# Patient Record
Sex: Male | Born: 1945 | Race: White | Hispanic: No | Marital: Married | Smoking: Current some day smoker
Health system: Southern US, Community
[De-identification: ages and names within clinical notes are randomized; demographics above are authoritative.]

## PROBLEM LIST (undated history)

## (undated) DIAGNOSIS — I82409 Acute embolism and thrombosis of unspecified deep veins of unspecified lower extremity: Secondary | ICD-10-CM

## (undated) HISTORY — PX: OTHER SURGICAL HISTORY: SHX169

## (undated) HISTORY — DX: Acute embolism and thrombosis of unspecified deep veins of unspecified lower extremity: I82.409

---

## 2003-11-20 ENCOUNTER — Emergency Department (HOSPITAL_COMMUNITY): Admission: EM | Admit: 2003-11-20 | Discharge: 2003-11-20 | Payer: Self-pay | Admitting: Emergency Medicine

## 2003-11-24 ENCOUNTER — Observation Stay (HOSPITAL_COMMUNITY): Admission: EM | Admit: 2003-11-24 | Discharge: 2003-11-25 | Payer: Self-pay | Admitting: Emergency Medicine

## 2003-11-24 ENCOUNTER — Ambulatory Visit (HOSPITAL_COMMUNITY): Admission: RE | Admit: 2003-11-24 | Discharge: 2003-11-24 | Payer: Self-pay | Admitting: Specialist

## 2003-12-15 ENCOUNTER — Encounter: Admission: RE | Admit: 2003-12-15 | Discharge: 2003-12-15 | Payer: Self-pay | Admitting: Specialist

## 2004-06-05 ENCOUNTER — Ambulatory Visit (HOSPITAL_COMMUNITY): Admission: RE | Admit: 2004-06-05 | Discharge: 2004-06-05 | Payer: Self-pay | Admitting: Emergency Medicine

## 2012-05-28 DIAGNOSIS — S8010XA Contusion of unspecified lower leg, initial encounter: Secondary | ICD-10-CM | POA: Diagnosis not present

## 2012-05-28 DIAGNOSIS — IMO0002 Reserved for concepts with insufficient information to code with codable children: Secondary | ICD-10-CM | POA: Diagnosis not present

## 2012-06-04 DIAGNOSIS — IMO0002 Reserved for concepts with insufficient information to code with codable children: Secondary | ICD-10-CM | POA: Diagnosis not present

## 2012-06-04 DIAGNOSIS — S8010XA Contusion of unspecified lower leg, initial encounter: Secondary | ICD-10-CM | POA: Diagnosis not present

## 2012-10-14 ENCOUNTER — Ambulatory Visit: Payer: Medicare Other

## 2012-10-14 ENCOUNTER — Ambulatory Visit (INDEPENDENT_AMBULATORY_CARE_PROVIDER_SITE_OTHER): Payer: Medicare Other | Admitting: Family Medicine

## 2012-10-14 VITALS — BP 100/66 | HR 39 | Temp 97.7°F | Resp 18 | Ht 70.0 in | Wt 164.0 lb

## 2012-10-14 DIAGNOSIS — S59909A Unspecified injury of unspecified elbow, initial encounter: Secondary | ICD-10-CM

## 2012-10-14 DIAGNOSIS — S6991XA Unspecified injury of right wrist, hand and finger(s), initial encounter: Secondary | ICD-10-CM

## 2012-10-14 DIAGNOSIS — M25569 Pain in unspecified knee: Secondary | ICD-10-CM

## 2012-10-14 DIAGNOSIS — M25562 Pain in left knee: Secondary | ICD-10-CM

## 2012-10-14 DIAGNOSIS — L0390 Cellulitis, unspecified: Secondary | ICD-10-CM

## 2012-10-14 DIAGNOSIS — S6990XA Unspecified injury of unspecified wrist, hand and finger(s), initial encounter: Secondary | ICD-10-CM | POA: Diagnosis not present

## 2012-10-14 DIAGNOSIS — S0993XA Unspecified injury of face, initial encounter: Secondary | ICD-10-CM | POA: Diagnosis not present

## 2012-10-14 DIAGNOSIS — S199XXA Unspecified injury of neck, initial encounter: Secondary | ICD-10-CM

## 2012-10-14 MED ORDER — DOXYCYCLINE HYCLATE 100 MG PO TABS: 100.0000 mg | ORAL_TABLET | Freq: Two times a day (BID) | ORAL | Status: DC

## 2012-10-14 NOTE — Progress Notes (Signed)
[facility]:  Office Visit  Chief Complaint:  Chief Complaint  Patient presents with  . injury to face    running monday night and fell on face, now has rash on right side of face with some swelling     HPI: Philip Huber is a 67 y.o. male who complains of : 1. Monday night was running at [facility] and had lots of trees and roots on the ground, he runs 3 miles in 20 minutes, and was going fast, hit a root with one of his foot and hit his head on the ground and the back of his head hit more than the front of his head and he slid on his face when he fell. He had a little HA. He did not have any LOC, denies n/v/double vision/blurred vision. This was Monday around 7 pm. He presents in the office with facial swelling on the right side. He denies any pain/throbbing/aching. "feels weird because feels big". His face does itch. He has been running for 30 years.   2. 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 Now it does not even hurt.   3. Left knee pain x 2 weeks, posterior and medial aspect. He only has pain whne he does the moon walk. He states it is getting better.  He has had it for 3 weeks, using pillow underneath his   No prior injuries or surgeries  No osteoporosis or osteopenia No asa or other blood thinners  History reviewed. No pertinent past medical history. History reviewed. No pertinent past surgical history. History   Social History  . Marital Status: Married    Spouse Name: N/A    Number of Children: N/A  . Years of Education: N/A   Social History Main Topics  . Smoking status: Current Some Day Smoker  . Smokeless tobacco: None  . Alcohol Use: Yes  . Drug Use: No  . Sexually Active: None   Other Topics Concern  . None   Social History Narrative  . None   History reviewed. No  pertinent family history. Allergies  Allergen Reactions  . Levaquin (Levofloxacin In D5w)    Prior to Admission medications   Not on File     ROS: The patient denies fevers, chills, night sweats, unintentional weight loss, chest pain, palpitations, wheezing, dyspnea on exertion, nausea, vomiting, abdominal pain, dysuria, hematuria, melena, numbness, weakness, or tingling.   All other systems have been reviewed and were otherwise negative with the exception of those mentioned in the HPI and as above.    PHYSICAL EXAM: Filed Vitals:   10/14/12 1546  BP: 100/66  Pulse: 39  Temp: 97.7 F (36.5 C)  Resp: 18   Filed Vitals:   10/14/12 1546  Height: 5\' 10"  (1.778 m)  Weight: 164 lb (74.39 kg)   Body mass index is 23.53 kg/(m^2).  General: Alert, no acute distress HEENT:  Normocephalic, atraumatic, oropharynx patent. EOMI, PERRLA, fundoscopic exam normal.  Cardiovascular:  Regular rate and rhythm, no rubs murmurs or gallops.  No Carotid bruits, radial pulse intact. No pedal edema.  Respiratory: Clear to auscultation bilaterally.  No wheezes, rales, or rhonchi.  No cyanosis, no use of accessory musculature GI: No organomegaly, abdomen is soft and non-tender, positive bowel sounds.  No masses. Skin: +  warm erythematous area, scab with slight swelling and erythema around 1/4 of his mid face. No vesicles, no e/o shingles. It is slighlty red, beefy cellulitic like Neurologic: Facial musculature symmetric. Psychiatric: Patient is appropriate throughout our interaction. Lymphatic: No cervical lymphadenopathy Musculoskeletal: Gait intact. Knee- left posterior medial nontender, full ROM, 5/5 strength, he has tenderness when he moonwalks, tight hamstring. +/- jt line tenderness Wrist and hand-full ROM, 5/5 strength, sensation intact   LABS: No results found for this or any previous visit.   EKG/XRAY:   Primary read interpreted by Dr. Conley Rolls at [facility]. No obvious fx/dislocation Please  comment on right temporal area , mastoid region   ASSESSMENT/PLAN: Encounter Diagnoses  Name Primary?  . Facial injury, initial encounter Yes  . Wrist injury, right, initial encounter   . Hand injury, right, initial encounter   . Left knee pain    He has had HR 45 at [facility] in March Facial contusion with scab and cellulitic rash vs dermatitis s/p fall He has abrasion that may be the source of the cellulitis Ligamentous strain on knee Wrist pain is just spraina dn strain s/p fall Rx Doxycycline 100 mg BID x 10 days Official rad results pending Gross sideeffects, risk and benefits, and alternatives of medications d/w patient. Patient is aware that all medications have potential sideeffects and we are unable to predict every sideeffect or drug-drug interaction that may occur. F/u prn     Kwasi Joung PHUONG, DO 10/14/2012 5:55 PM

## 2014-10-20 IMAGING — CR DG FACIAL BONES COMPLETE 3+V
3 series · 3 of 3 positions shown · non-contrast
Comparison: None.

CLINICAL DATA: Fall with facial injuries.

FACIAL BONES COMPLETE 3+V

[pa [person_name]]
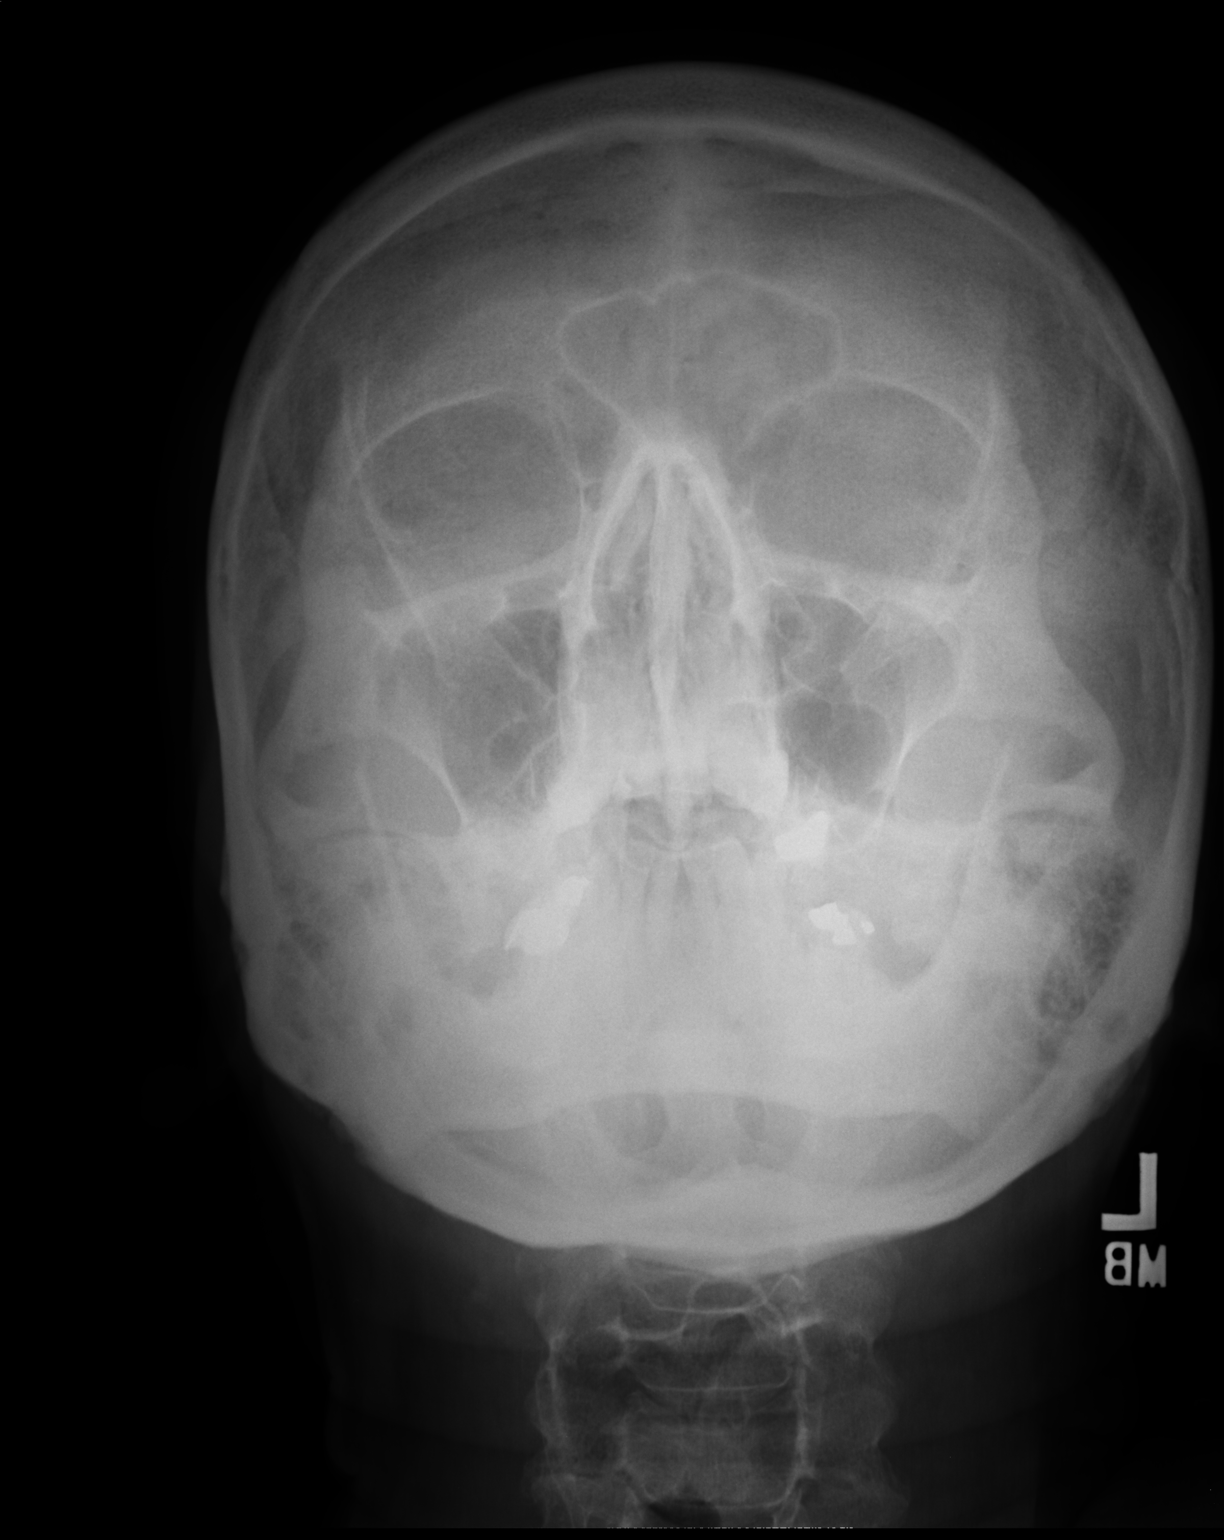

[waters]
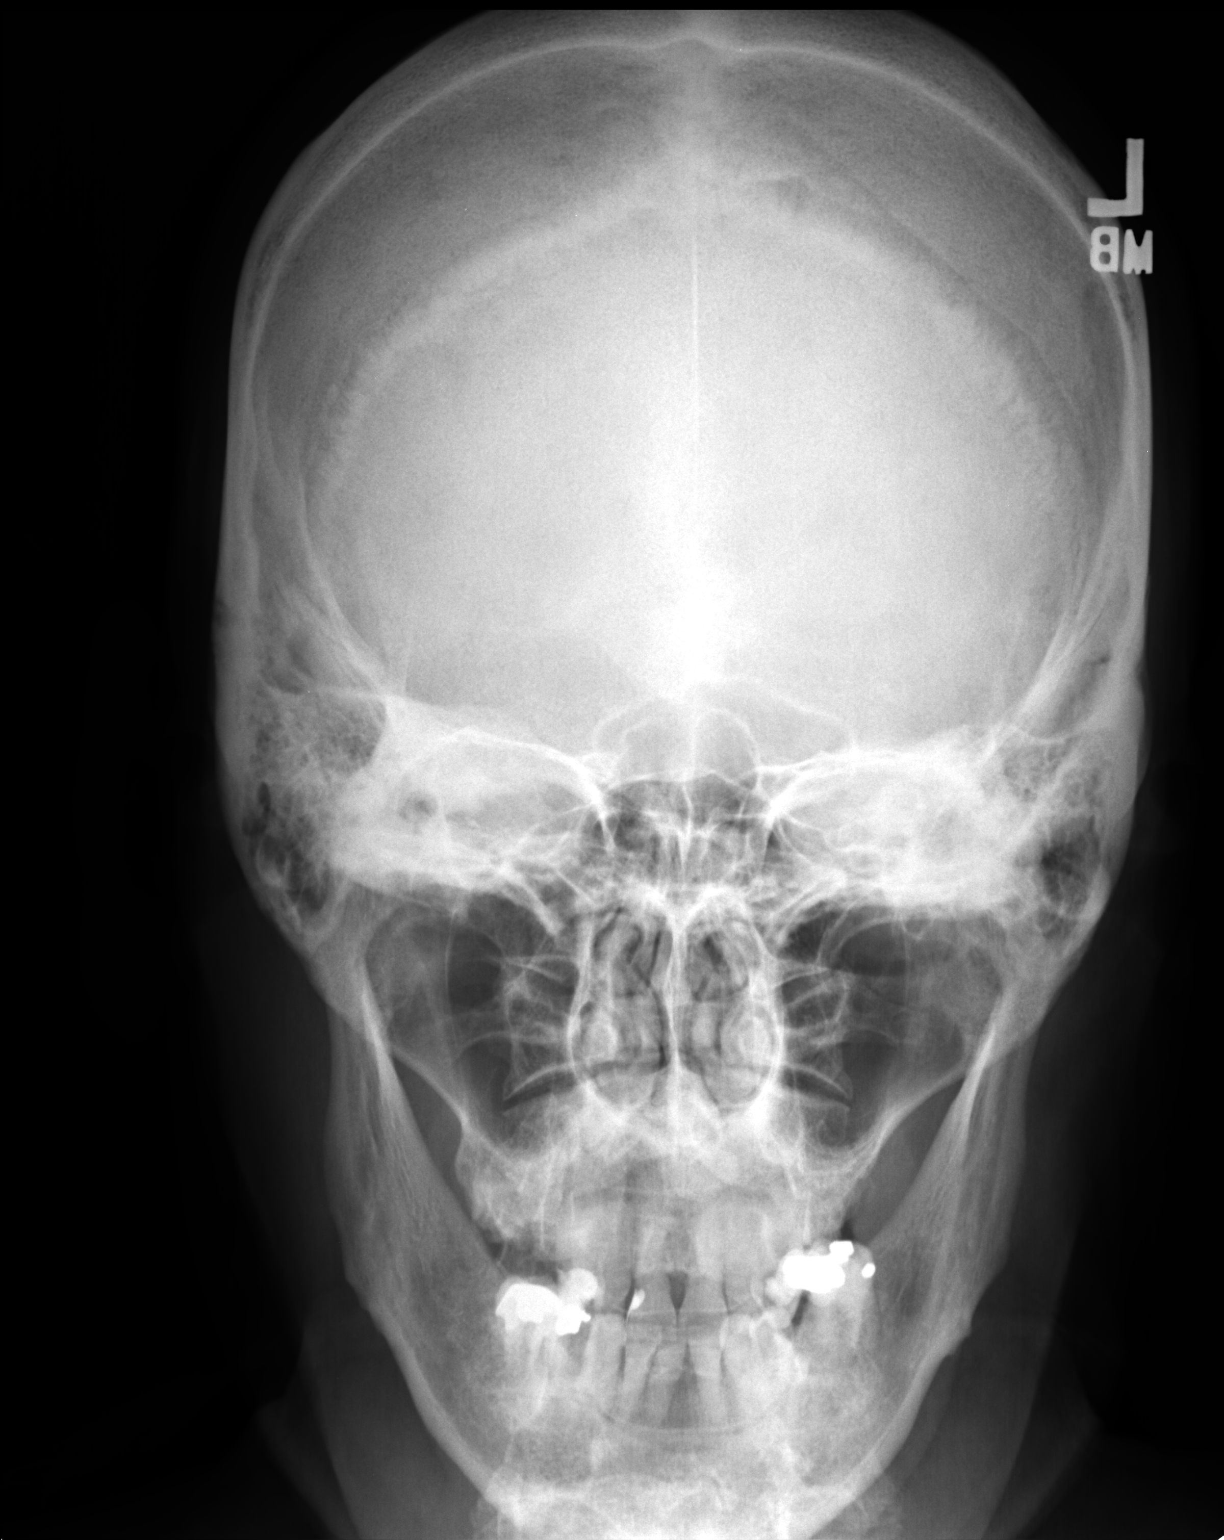

[lateral]
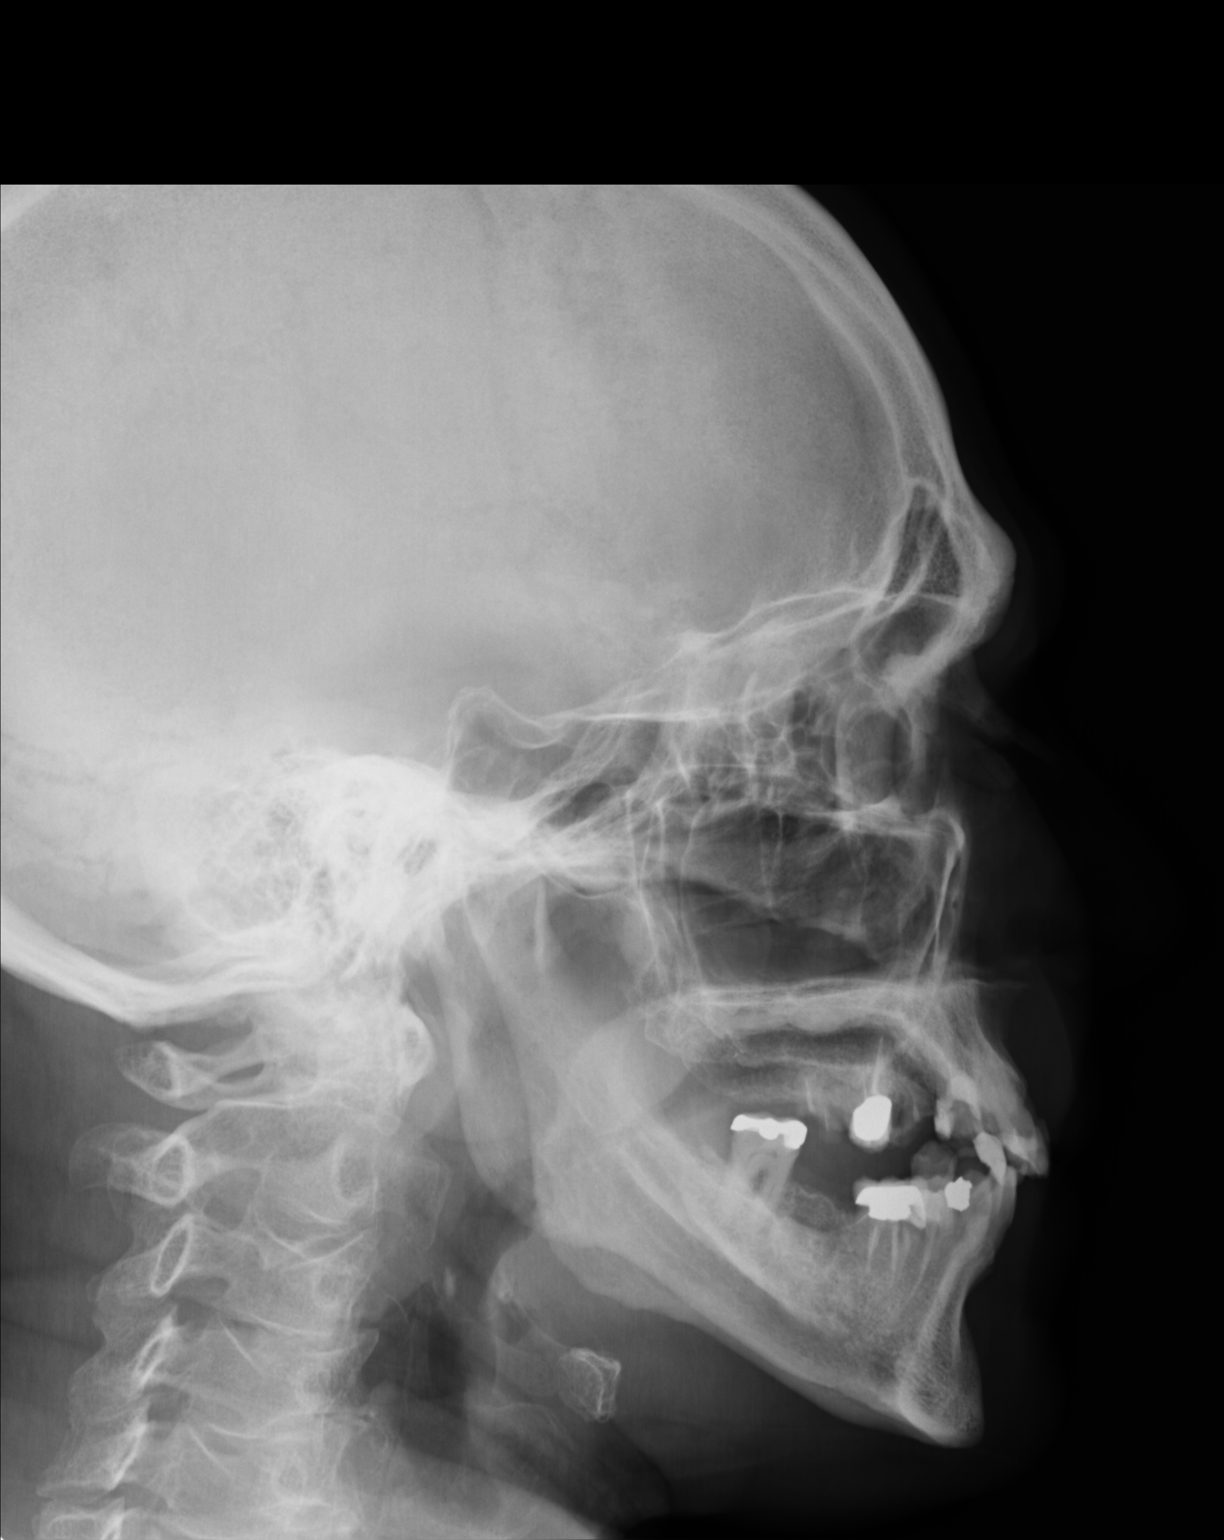

[3 of 3 positions shown; findings below may reference images not displayed]

FINDINGS: No acute fracture of the facial bones is identified.  The
visualized paranasal sinuses appear normally aerated.  The nasal
septum is in the midline.  No soft tissue foreign body is seen.
IMPRESSION: Normal facial bone radiographs.

Clinically significant discrepancy from primary report, if
provided: None

## 2015-01-19 DIAGNOSIS — L570 Actinic keratosis: Secondary | ICD-10-CM | POA: Diagnosis not present

## 2015-01-19 DIAGNOSIS — M25561 Pain in right knee: Secondary | ICD-10-CM | POA: Diagnosis not present

## 2015-01-30 DIAGNOSIS — M25561 Pain in right knee: Secondary | ICD-10-CM | POA: Diagnosis not present

## 2015-03-05 DIAGNOSIS — L821 Other seborrheic keratosis: Secondary | ICD-10-CM | POA: Diagnosis not present

## 2015-03-05 DIAGNOSIS — L570 Actinic keratosis: Secondary | ICD-10-CM | POA: Diagnosis not present

## 2015-03-05 DIAGNOSIS — D1801 Hemangioma of skin and subcutaneous tissue: Secondary | ICD-10-CM | POA: Diagnosis not present

## 2015-05-04 DIAGNOSIS — L570 Actinic keratosis: Secondary | ICD-10-CM | POA: Diagnosis not present

## 2015-05-22 DIAGNOSIS — Z23 Encounter for immunization: Secondary | ICD-10-CM | POA: Diagnosis not present

## 2015-05-22 DIAGNOSIS — Z0000 Encounter for general adult medical examination without abnormal findings: Secondary | ICD-10-CM | POA: Diagnosis not present

## 2015-05-22 DIAGNOSIS — Z7289 Other problems related to lifestyle: Secondary | ICD-10-CM | POA: Diagnosis not present

## 2015-05-22 DIAGNOSIS — Z86718 Personal history of other venous thrombosis and embolism: Secondary | ICD-10-CM | POA: Diagnosis not present

## 2015-05-22 DIAGNOSIS — I444 Left anterior fascicular block: Secondary | ICD-10-CM | POA: Diagnosis not present

## 2015-05-22 DIAGNOSIS — R001 Bradycardia, unspecified: Secondary | ICD-10-CM | POA: Diagnosis not present

## 2015-05-22 DIAGNOSIS — Z125 Encounter for screening for malignant neoplasm of prostate: Secondary | ICD-10-CM | POA: Diagnosis not present

## 2015-05-22 DIAGNOSIS — Z136 Encounter for screening for cardiovascular disorders: Secondary | ICD-10-CM | POA: Diagnosis not present

## 2015-05-30 DIAGNOSIS — R05 Cough: Secondary | ICD-10-CM | POA: Diagnosis not present

## 2015-05-30 DIAGNOSIS — J209 Acute bronchitis, unspecified: Secondary | ICD-10-CM | POA: Diagnosis not present

## 2015-05-31 ENCOUNTER — Telehealth: Payer: Self-pay | Admitting: Cardiology

## 2015-05-31 NOTE — Telephone Encounter (Signed)
Received records from [place] for appointment on 06/14/15 with Dr Percival Spanish.  Records given to [facility] (medical records) for Dr Hochrein's schedule on 06/14/15. lp

## 2015-06-11 NOTE — Progress Notes (Signed)
Cardiology Office Note   Date:  06/12/2015   ID:  Philip, Huber 11/19/1945, MRN [id number]  PCP:  No PCP Per Patient  Cardiologist:   Minus Breeding, MD   Chief Complaint  Patient presents with  . New Evaluation      History of Present Illness: Philip Huber is a 70 y.o. male who presents for Evaluation of an abnormal EKG. The patient has no past cardiac history. He has been an avid exerciser all of his life. He's played hockey. He runs routinely. He still runs 3 miles daily. With this level of activity he denies any cardiovascular symptoms. The patient denies any new symptoms such as chest discomfort, neck or arm discomfort. There has been no new shortness of breath, PND or orthopnea. There have been no reported palpitations, presyncope or syncope.  However, he has been noted to have a low heart rate. EKGs have demonstrated sinus rhythm in the 50s. 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 He also is noted to have poor anterior R wave progression and rare PACs.   Past Medical History  Diagnosis Date  . DVT (deep venous thrombosis) [facility])     Past Surgical History  Procedure Laterality Date  . None       No current outpatient prescriptions on file.   No current facility-administered medications for this visit.    Allergies:   Levaquin    Social History:  The patient  reports that he has been smoking.  He does not have any smokeless tobacco history on file. He reports that he drinks alcohol. He reports that he does not use illicit drugs.   Family History:  The patient's family history includes Stroke in his father and mother.    ROS:  Please see the history of present illness.   Otherwise, review of systems are positive for none.   All other systems are reviewed and negative.    PHYSICAL EXAM: VS:  BP 106/68 mmHg  Pulse 55  Ht 5\' 10"  (1.778 m)  Wt 162 lb (73.483 kg)  BMI 23.24 kg/m2 , BMI Body mass index is 23.24 kg/(m^2). GENERAL:  Well  appearing HEENT:  Pupils equal round and reactive, fundi not visualized, oral mucosa unremarkable NECK:  No jugular venous distention, waveform within normal limits, carotid upstroke brisk and symmetric, no bruits, no thyromegaly LYMPHATICS:  No cervical, inguinal adenopathy LUNGS:  Clear to auscultation bilaterally BACK:  No CVA tenderness CHEST:  Unremarkable HEART:  PMI not displaced or sustained,S1 and S2 within normal limits, no S3, no S4, no clicks, no rubs, no murmurs ABD:  Flat, positive bowel sounds normal in frequency in pitch, no bruits, no rebound, no guarding, no midline pulsatile mass, no hepatomegaly, no splenomegaly EXT:  2 plus pulses throughout, no edema, no cyanosis no clubbing SKIN:  Positive facial rashe no nodules NEURO:  Cranial nerves II through XII grossly intact, motor grossly intact throughout PSYCH:  Cognitively intact, oriented to person place and time    EKG:  EKG is ordered today. The ekg ordered today demonstrates sinus rhythm, rate 55, left axis deviation, left anterior fascicular block, poor anterior R wave progression, premature atrial contraction.   Recent Labs: No results found for requested labs within last 365 days.    Lipid Panel No results found for: CHOL, TRIG, HDL, CHOLHDL, VLDL, LDLCALC, LDLDIRECT    Wt Readings from Last 3 Encounters:  06/12/15 162 lb (73.483 kg)  10/14/12 164 lb (74.39 kg)  Other studies Reviewed: Additional studies/ records that were reviewed today include: Office records and labs. Review of the above records demonstrates:  Please see elsewhere in the note.     ASSESSMENT AND PLAN:  ABNORMAL EKG:  The patient does have poor anterior R wave progression. I suspect clockwise rotation of his heart in an athlete with probably big lungs. However, screening with stress testing is reasonable. I will bring the patient back for a POET (Plain Old Exercise Test). This will allow me to screen for obstructive coronary  disease, risk stratify and very importantly provide a prescription for exercise.  LAFB:  This is noted along with bradycardia. However, he has no symptoms. Bradycardia is probably related to heightened vagal tone related to his level of fitness. No further evaluation is warranted.   Current medicines are reviewed at length with the patient today.  The patient does not have concerns regarding medicines.  The following changes have been made:  no change  Labs/ tests ordered today include:   Orders Placed This Encounter  Procedures  . Exercise Tolerance Test  . EKG 12-Lead     Disposition:   FU with me as needed.      Signed, Minus Breeding, MD  06/12/2015 1:10 PM    [facility]

## 2015-06-12 ENCOUNTER — Encounter: Payer: Self-pay | Admitting: Cardiology

## 2015-06-12 ENCOUNTER — Ambulatory Visit (INDEPENDENT_AMBULATORY_CARE_PROVIDER_SITE_OTHER): Payer: Medicare Other | Admitting: Cardiology

## 2015-06-12 VITALS — BP 106/68 | HR 55 | Ht 70.0 in | Wt 162.0 lb

## 2015-06-12 DIAGNOSIS — R9431 Abnormal electrocardiogram [ECG] [EKG]: Secondary | ICD-10-CM

## 2015-06-12 NOTE — Patient Instructions (Signed)
Your physician recommends that you schedule a follow-up appointment in: As Needed  Your physician has requested that you have an exercise tolerance test. For further information please visit [email or web address]. Please also follow instruction sheet, as given.    

## 2015-06-14 ENCOUNTER — Ambulatory Visit: Payer: Self-pay | Admitting: Cardiology

## 2015-06-22 ENCOUNTER — Telehealth (HOSPITAL_COMMUNITY): Payer: Self-pay

## 2015-06-22 DIAGNOSIS — L570 Actinic keratosis: Secondary | ICD-10-CM | POA: Diagnosis not present

## 2015-06-22 NOTE — Telephone Encounter (Signed)
Encounter complete. 

## 2015-06-27 ENCOUNTER — Ambulatory Visit (HOSPITAL_COMMUNITY)
Admission: RE | Admit: 2015-06-27 | Discharge: 2015-06-27 | Disposition: A | Discharge status: Home / Self Care | Payer: Medicare Other | Source: Ambulatory Visit | Attending: Cardiology | Admitting: Cardiology

## 2015-06-27 DIAGNOSIS — R9439 Abnormal result of other cardiovascular function study: Secondary | ICD-10-CM | POA: Diagnosis not present

## 2015-06-27 DIAGNOSIS — R9431 Abnormal electrocardiogram [ECG] [EKG]: Secondary | ICD-10-CM | POA: Insufficient documentation

## 2015-06-28 LAB — EXERCISE TOLERANCE TEST
CHL CUP MPHR: 150 {beats}/min
CSEPEW: 15.4 METS
CSEPHR: 94 %
CSEPPHR: 141 {beats}/min
Exercise duration (min): 13 min
Exercise duration (sec): 4 s
RPE: 17
Rest HR: 40 {beats}/min

## 2016-01-10 DIAGNOSIS — D1801 Hemangioma of skin and subcutaneous tissue: Secondary | ICD-10-CM | POA: Diagnosis not present

## 2016-01-10 DIAGNOSIS — L821 Other seborrheic keratosis: Secondary | ICD-10-CM | POA: Diagnosis not present

## 2016-01-10 DIAGNOSIS — L814 Other melanin hyperpigmentation: Secondary | ICD-10-CM | POA: Diagnosis not present

## 2016-01-10 DIAGNOSIS — D225 Melanocytic nevi of trunk: Secondary | ICD-10-CM | POA: Diagnosis not present

## 2020-04-30 DIAGNOSIS — H52223 Regular astigmatism, bilateral: Secondary | ICD-10-CM | POA: Diagnosis not present

## 2020-04-30 DIAGNOSIS — H524 Presbyopia: Secondary | ICD-10-CM | POA: Diagnosis not present

## 2020-04-30 DIAGNOSIS — H5203 Hypermetropia, bilateral: Secondary | ICD-10-CM | POA: Diagnosis not present

## 2020-04-30 DIAGNOSIS — H25043 Posterior subcapsular polar age-related cataract, bilateral: Secondary | ICD-10-CM | POA: Diagnosis not present

## 2020-09-21 DIAGNOSIS — L309 Dermatitis, unspecified: Secondary | ICD-10-CM | POA: Diagnosis not present

## 2020-09-21 DIAGNOSIS — R1011 Right upper quadrant pain: Secondary | ICD-10-CM | POA: Diagnosis not present

## 2020-09-24 ENCOUNTER — Other Ambulatory Visit: Payer: Self-pay | Admitting: Family Medicine

## 2020-09-24 DIAGNOSIS — R1011 Right upper quadrant pain: Secondary | ICD-10-CM

## 2020-10-03 ENCOUNTER — Ambulatory Visit
Admission: RE | Admit: 2020-10-03 | Discharge: 2020-10-03 | Disposition: A | Discharge status: Home / Self Care | Payer: Medicare Other | Source: Ambulatory Visit | Attending: Family Medicine | Admitting: Family Medicine

## 2020-10-03 DIAGNOSIS — R1011 Right upper quadrant pain: Secondary | ICD-10-CM | POA: Diagnosis not present

## 2020-10-18 DIAGNOSIS — Z0000 Encounter for general adult medical examination without abnormal findings: Secondary | ICD-10-CM | POA: Diagnosis not present

## 2020-10-23 DIAGNOSIS — Z1211 Encounter for screening for malignant neoplasm of colon: Secondary | ICD-10-CM | POA: Diagnosis not present

## 2022-01-22 DIAGNOSIS — Z79899 Other long term (current) drug therapy: Secondary | ICD-10-CM | POA: Diagnosis not present

## 2022-01-22 DIAGNOSIS — L309 Dermatitis, unspecified: Secondary | ICD-10-CM | POA: Diagnosis not present

## 2022-01-22 DIAGNOSIS — Z136 Encounter for screening for cardiovascular disorders: Secondary | ICD-10-CM | POA: Diagnosis not present

## 2022-01-22 DIAGNOSIS — Z0000 Encounter for general adult medical examination without abnormal findings: Secondary | ICD-10-CM | POA: Diagnosis not present

## 2022-01-22 DIAGNOSIS — F17200 Nicotine dependence, unspecified, uncomplicated: Secondary | ICD-10-CM | POA: Diagnosis not present

## 2022-03-14 DIAGNOSIS — U071 COVID-19: Secondary | ICD-10-CM | POA: Diagnosis not present

## 2022-03-14 DIAGNOSIS — R110 Nausea: Secondary | ICD-10-CM | POA: Diagnosis not present

## 2022-03-25 DIAGNOSIS — U071 COVID-19: Secondary | ICD-10-CM | POA: Diagnosis not present

## 2022-09-24 DIAGNOSIS — K4090 Unilateral inguinal hernia, without obstruction or gangrene, not specified as recurrent: Secondary | ICD-10-CM | POA: Diagnosis not present

## 2022-10-09 IMAGING — US US ABDOMEN LIMITED
1 series · 14 of 25 positions shown · non-contrast
Comparison: None.

CLINICAL DATA: Right upper quadrant abdominal pain

EXAM:
ULTRASOUND ABDOMEN LIMITED RIGHT UPPER QUADRANT

[Series 1: us abdomen limited · 0.14mm/px · 14 of 47 slices shown]
[im 1/47]
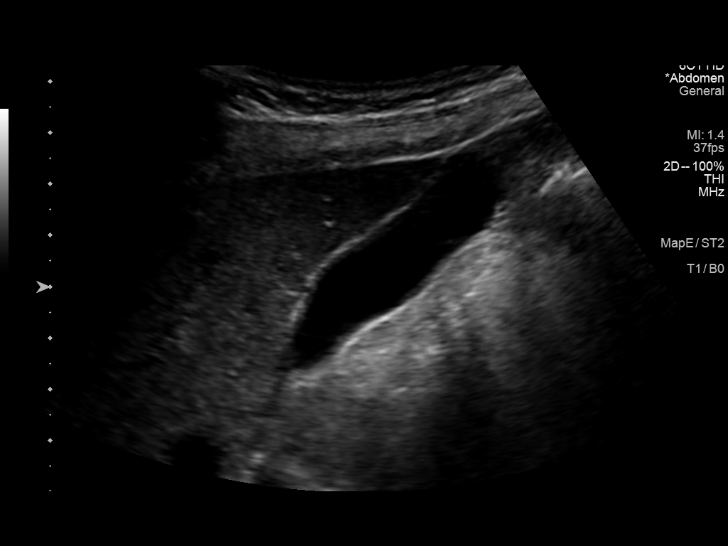
[im 4/47]
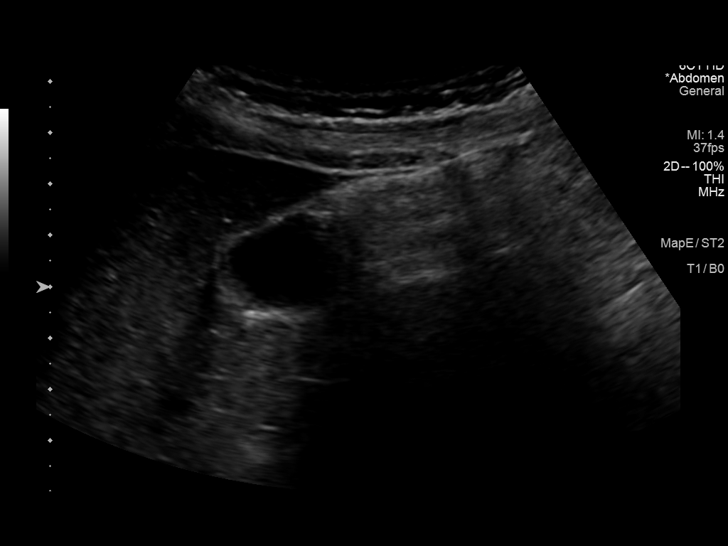
[im 8/47]
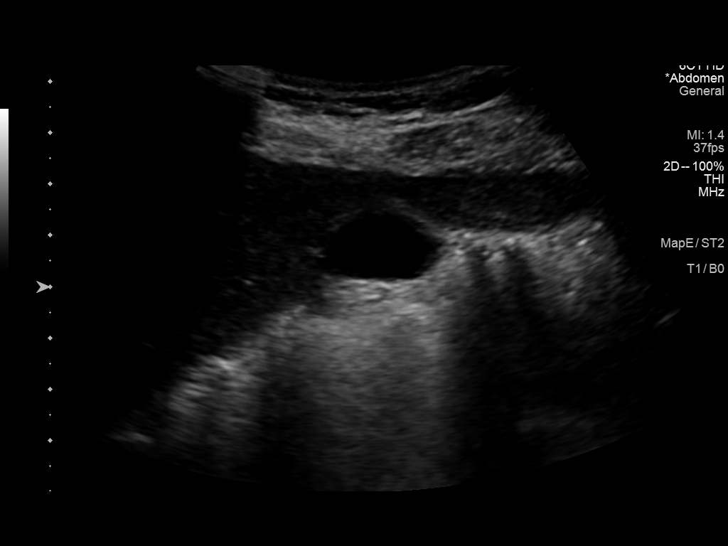
[im 12/47]
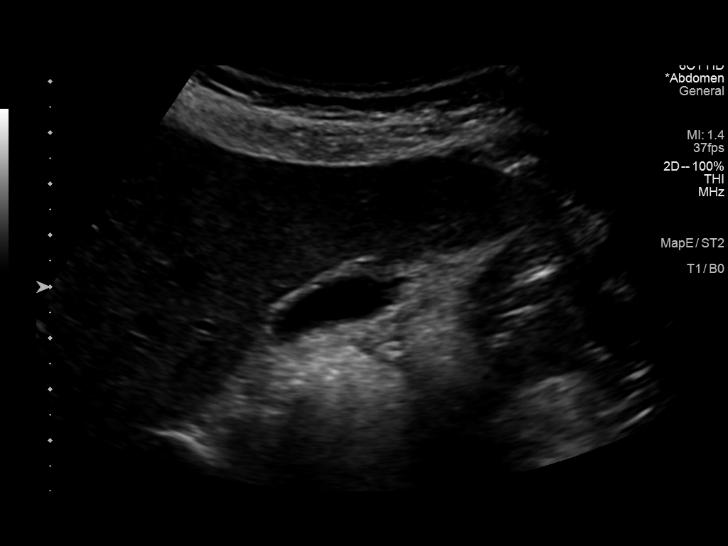
[im 16/47]
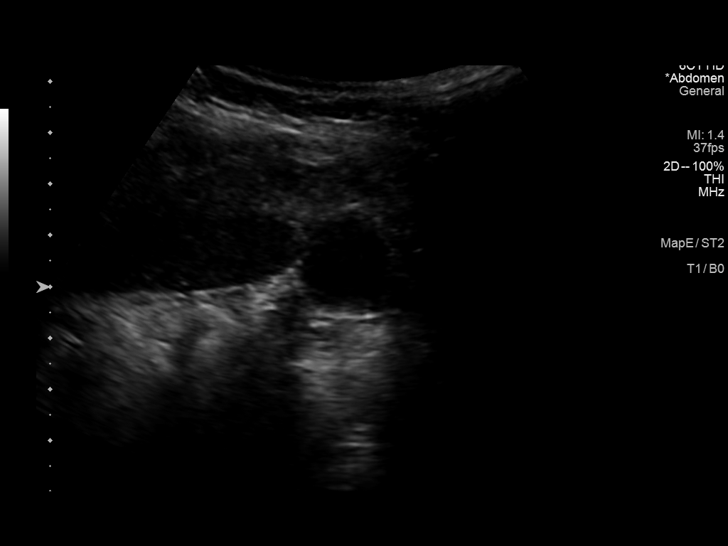
[im 18/47]
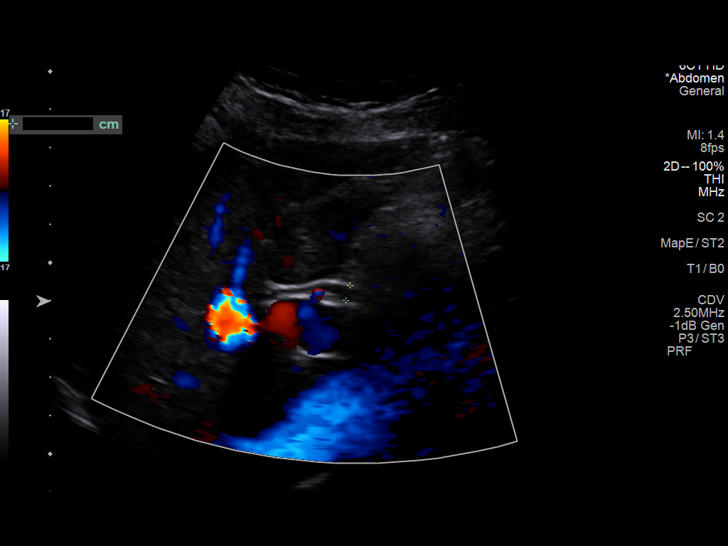
[im 22/47]
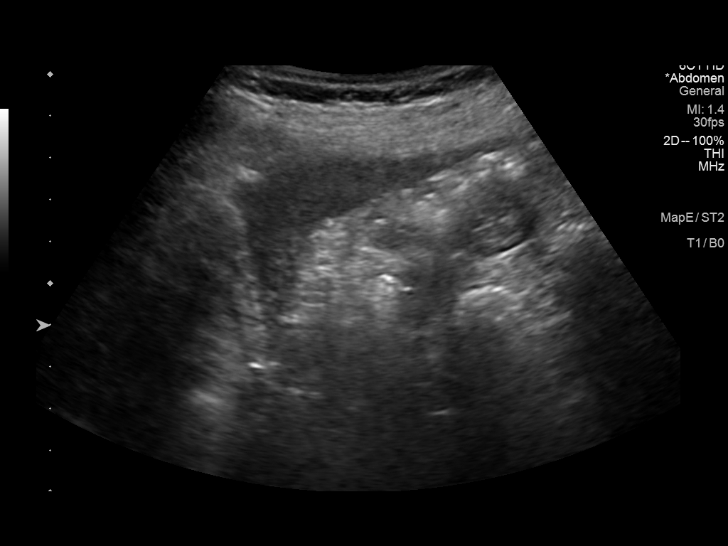
[im 25/47]
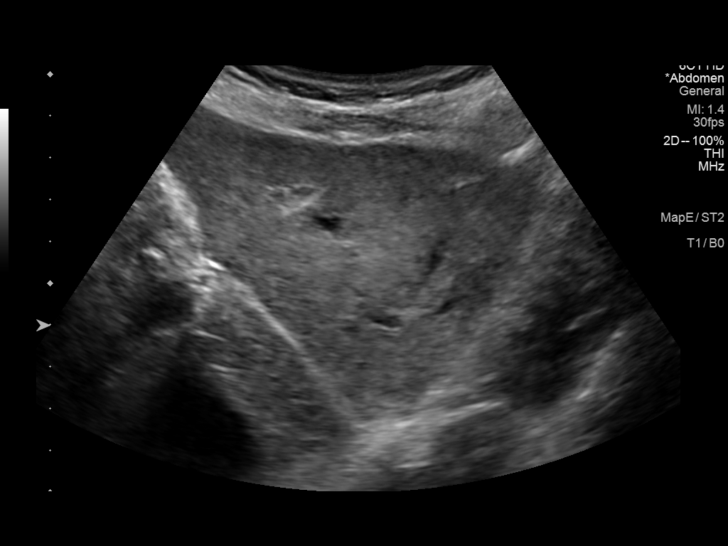
[im 29/47]
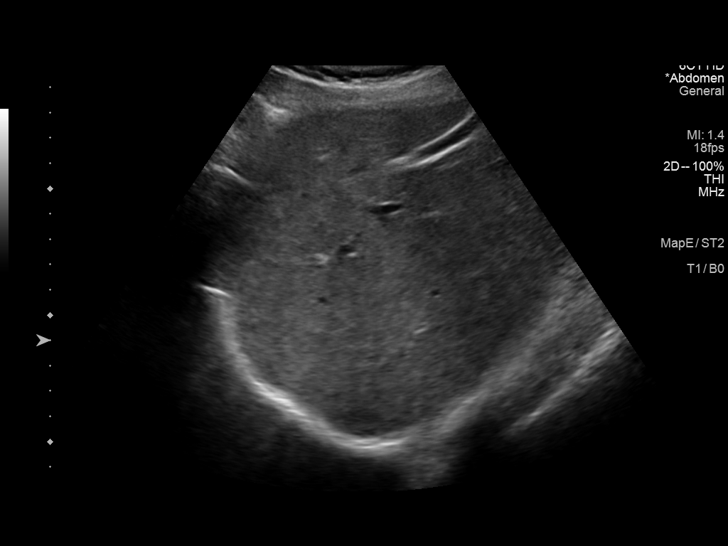
[im 31/47]
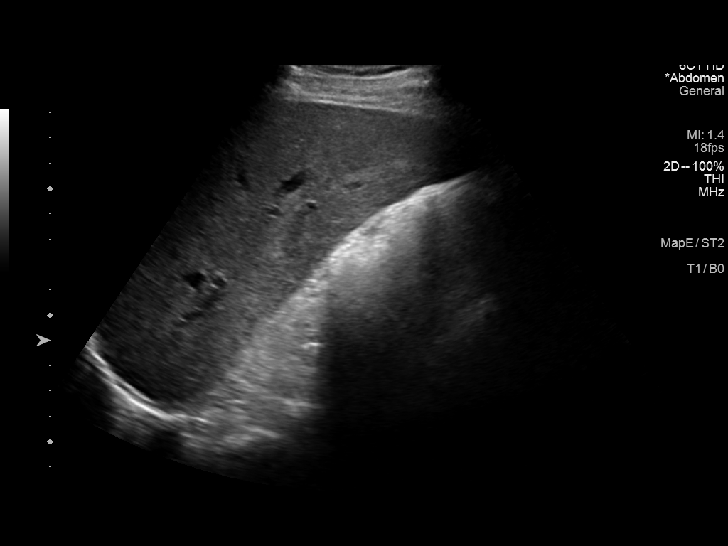
[im 35/47]
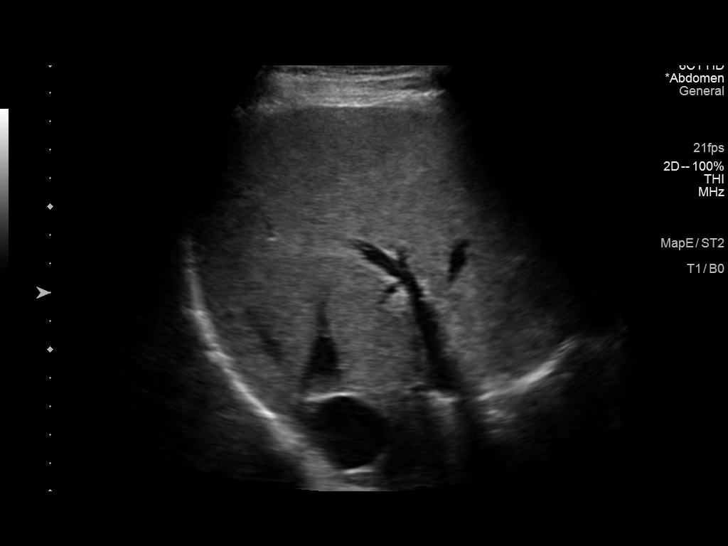
[im 39/47]
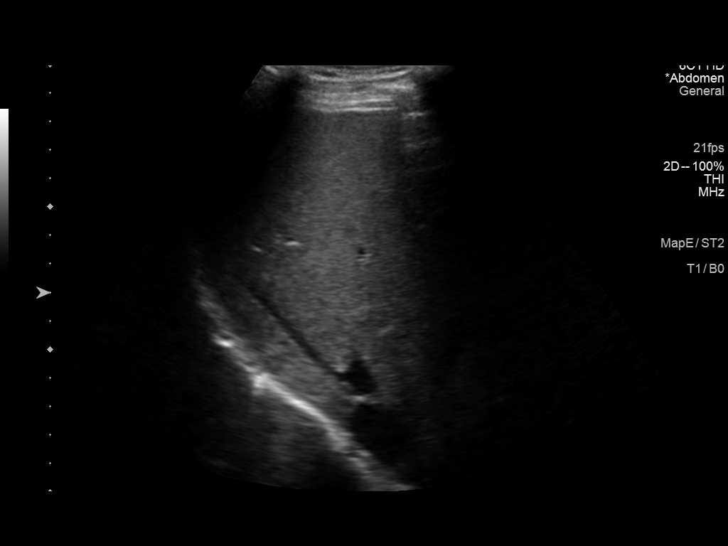
[im 43/47]
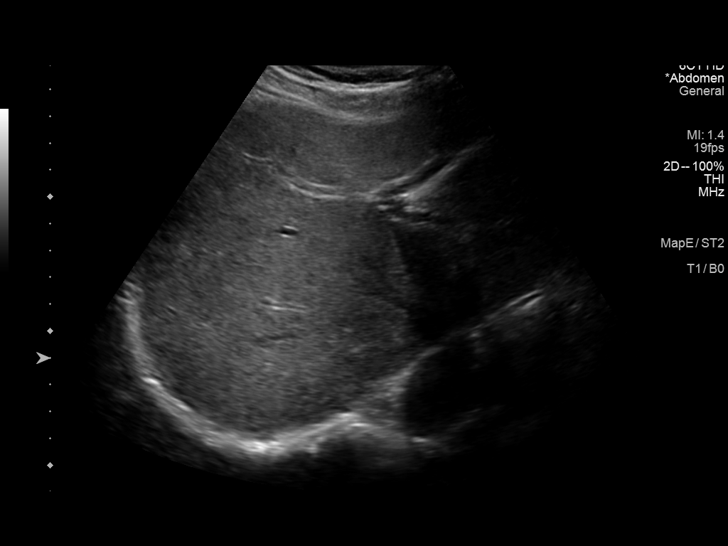
[im 47/47]
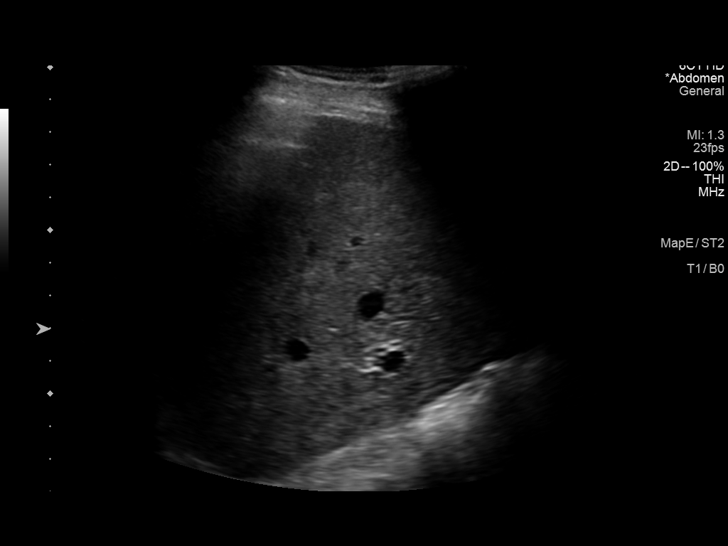

[14 of 25 positions shown; findings below may reference images not displayed]

FINDINGS: Gallbladder:

No gallstones or wall thickening visualized. No sonographic Murphy
sign noted by sonographer.

Common bile duct:

Diameter: 4 mm in proximal diameter

Liver:

No focal lesion identified. Within normal limits in parenchymal
echogenicity. Portal vein is patent on color Doppler imaging with
normal direction of blood flow towards the liver.

Other: None.
IMPRESSION: Normal right upper quadrant sonogram

## 2022-11-07 DIAGNOSIS — K4090 Unilateral inguinal hernia, without obstruction or gangrene, not specified as recurrent: Secondary | ICD-10-CM | POA: Diagnosis not present

## 2022-11-21 ENCOUNTER — Emergency Department (HOSPITAL_BASED_OUTPATIENT_CLINIC_OR_DEPARTMENT_OTHER): Payer: Medicare Other

## 2022-11-21 ENCOUNTER — Encounter (HOSPITAL_BASED_OUTPATIENT_CLINIC_OR_DEPARTMENT_OTHER): Payer: Self-pay | Admitting: Emergency Medicine

## 2022-11-21 ENCOUNTER — Other Ambulatory Visit: Payer: Self-pay

## 2022-11-21 ENCOUNTER — Emergency Department (HOSPITAL_BASED_OUTPATIENT_CLINIC_OR_DEPARTMENT_OTHER)
Admission: EM | Admit: 2022-11-21 | Discharge: 2022-11-21 | Disposition: A | Discharge status: Home / Self Care | Payer: Medicare Other | Attending: Emergency Medicine | Admitting: Emergency Medicine

## 2022-11-21 DIAGNOSIS — I82452 Acute embolism and thrombosis of left peroneal vein: Secondary | ICD-10-CM | POA: Diagnosis not present

## 2022-11-21 DIAGNOSIS — Z7901 Long term (current) use of anticoagulants: Secondary | ICD-10-CM | POA: Diagnosis not present

## 2022-11-21 DIAGNOSIS — I824Y2 Acute embolism and thrombosis of unspecified deep veins of left proximal lower extremity: Secondary | ICD-10-CM | POA: Diagnosis not present

## 2022-11-21 DIAGNOSIS — G8918 Other acute postprocedural pain: Secondary | ICD-10-CM

## 2022-11-21 DIAGNOSIS — M79662 Pain in left lower leg: Secondary | ICD-10-CM | POA: Diagnosis not present

## 2022-11-21 DIAGNOSIS — I82402 Acute embolism and thrombosis of unspecified deep veins of left lower extremity: Secondary | ICD-10-CM | POA: Diagnosis not present

## 2022-11-21 LAB — CBC WITH DIFFERENTIAL/PLATELET
Abs Immature Granulocytes: 0.02 10*3/uL (ref 0.00–0.07)
Basophils Absolute: 0.1 10*3/uL (ref 0.0–0.1)
Basophils Relative: 1 %
Eosinophils Absolute: 0.2 10*3/uL (ref 0.0–0.5)
Eosinophils Relative: 2 %
HCT: 36.5 % — ABNORMAL LOW (ref 39.0–52.0)
Hemoglobin: 12.7 g/dL — ABNORMAL LOW (ref 13.0–17.0)
Immature Granulocytes: 0 %
Lymphocytes Relative: 17 %
Lymphs Abs: 1.4 10*3/uL (ref 0.7–4.0)
MCH: 31.9 pg (ref 26.0–34.0)
MCHC: 34.8 g/dL (ref 30.0–36.0)
MCV: 91.7 fL (ref 80.0–100.0)
Monocytes Absolute: 0.7 10*3/uL (ref 0.1–1.0)
Monocytes Relative: 8 %
Neutro Abs: 6 10*3/uL (ref 1.7–7.7)
Neutrophils Relative %: 72 %
Platelets: 187 10*3/uL (ref 150–400)
RBC: 3.98 MIL/uL — ABNORMAL LOW (ref 4.22–5.81)
RDW: 12.7 % (ref 11.5–15.5)
WBC: 8.3 10*3/uL (ref 4.0–10.5)
nRBC: 0 % (ref 0.0–0.2)

## 2022-11-21 LAB — COMPREHENSIVE METABOLIC PANEL
ALT: 10 U/L (ref 0–44)
AST: 15 U/L (ref 15–41)
Albumin: 3.7 g/dL (ref 3.5–5.0)
Alkaline Phosphatase: 45 U/L (ref 38–126)
Anion gap: 7 (ref 5–15)
BUN: 17 mg/dL (ref 8–23)
CO2: 29 mmol/L (ref 22–32)
Calcium: 8.6 mg/dL — ABNORMAL LOW (ref 8.9–10.3)
Chloride: 96 mmol/L — ABNORMAL LOW (ref 98–111)
Creatinine, Ser: 0.74 mg/dL (ref 0.61–1.24)
GFR, Estimated: >60 mL/min (ref >60)
Glucose, Bld: 91 mg/dL (ref 70–99)
Potassium: 4.1 mmol/L (ref 3.5–5.1)
Sodium: 132 mmol/L — ABNORMAL LOW (ref 135–145)
Total Bilirubin: 0.5 mg/dL (ref 0.3–1.2)
Total Protein: 6.4 g/dL — ABNORMAL LOW (ref 6.5–8.1)

## 2022-11-21 MED ORDER — APIXABAN 2.5 MG PO TABS
10.0000 mg | ORAL_TABLET | Freq: Once | ORAL | Status: AC
  Administered 2022-11-21: 10 mg via ORAL
  Filled 2022-11-21: qty 4

## 2022-11-21 MED ORDER — APIXABAN (ELIQUIS) VTE STARTER PACK (10MG AND 5MG): ORAL_TABLET | ORAL | 0 refills | Status: DC

## 2022-11-21 MED ORDER — APIXABAN (ELIQUIS) VTE STARTER PACK (10MG AND 5MG): ORAL_TABLET | ORAL | 0 refills | Status: AC

## 2022-11-21 NOTE — ED Notes (Signed)
Gen surg paged

## 2022-11-21 NOTE — ED Provider Notes (Signed)
[facility] AT [facility] Provider Note   CSN: [id number] Arrival date & time: 11/21/22  1408     History  Chief Complaint  Patient presents with   Leg Pain   Groin Swelling    Philip Huber is a 77 y.o. male.  Patient here with left calf pain for the last few days.  He had hernia surgery about 2 weeks ago.  He has history of blood clot in the right leg but no longer on blood thinners.  This clot happened after an injury.  He has noticed now some ongoing swelling in his left inguinal hernia site where he had surgery.  Swelling has been fairly constant started about 3 to 4 days after surgery.  But he is able to go to the bathroom without any issues.  No nausea or vomiting.  No fever or chills.  Nothing makes worse or better.  The history is provided by the patient.       Home Medications Prior to Admission medications   Medication Sig Start Date End Date Taking? Authorizing Provider  APIXABAN Everlene Balls) VTE STARTER PACK (10MG  AND 5MG ) Take as directed on package: start with two-5mg  tablets twice daily for 7 days. On day 8, switch to one-5mg  tablet twice daily. 11/21/22  Yes Emry Barbato, DO      Allergies    Levaquin [levofloxacin in d5w]    Review of Systems   Review of Systems  Physical Exam Updated Vital Signs BP 139/81   Pulse (!) 49   Temp 98.5 F (36.9 C) (Oral)   Resp 15   SpO2 97%  Physical Exam Vitals and nursing note reviewed.  Constitutional:      General: He is not in acute distress.    Appearance: He is well-developed. He is not ill-appearing.  HENT:     Head: Normocephalic and atraumatic.     Nose: Nose normal.     Mouth/Throat:     Mouth: Mucous membranes are moist.  Eyes:     Extraocular Movements: Extraocular movements intact.     Conjunctiva/sclera: Conjunctivae normal.     Pupils: Pupils are equal, round, and reactive to light.  Cardiovascular:     Rate and Rhythm: Normal rate and regular rhythm.     Pulses:  Normal pulses.     Heart sounds: Normal heart sounds. No murmur heard. Pulmonary:     Effort: Pulmonary effort is normal. No respiratory distress.     Breath sounds: Normal breath sounds.  Abdominal:     General: Abdomen is flat.     Palpations: Abdomen is soft.     Tenderness: There is no abdominal tenderness.     Comments: Patient has swelling over the left inguinal hernia area, no redness no warmth no real fluctuance but I am not able to really reduce swelling in this area not sure if this is a hernia or may be a postop seroma or may be expected postop changes  Musculoskeletal:        General: Tenderness present. No swelling. Normal range of motion.     Cervical back: Normal range of motion and neck supple.     Comments: tenderess to the left calf  Skin:    General: Skin is warm and dry.     Capillary Refill: Capillary refill takes less than 2 seconds.  Neurological:     Mental Status: He is alert.  Psychiatric:        Mood and Affect: Mood normal.  ED Results / Procedures / Treatments   Labs (all labs ordered are listed, but only abnormal results are displayed) Labs Reviewed  CBC WITH DIFFERENTIAL/PLATELET - Abnormal; Notable for the following components:      Result Value   RBC 3.98 (*)    Hemoglobin 12.7 (*)    HCT 36.5 (*)    All other components within normal limits  COMPREHENSIVE METABOLIC PANEL - Abnormal; Notable for the following components:   Sodium 132 (*)    Chloride 96 (*)    Calcium 8.6 (*)    Total Protein 6.4 (*)    All other components within normal limits    EKG None  Radiology US Venous Img Lower Unilateral Left  Result Date: 11/21/2022 CLINICAL DATA:  Left calf pain.  History of prior DVT in 2004. EXAM: LEFT LOWER EXTREMITY VENOUS DOPPLER ULTRASOUND TECHNIQUE: Gray-scale sonography with graded compression, as well as color Doppler and duplex ultrasound were performed to evaluate the lower extremity deep venous systems from the level of the  common femoral vein and including the common femoral, femoral, profunda femoral, popliteal and calf veins including the posterior tibial, peroneal and gastrocnemius veins when visible. The superficial great saphenous vein was also interrogated. Spectral Doppler was utilized to evaluate flow at rest and with distal augmentation maneuvers in the common femoral, femoral and popliteal veins. COMPARISON:  None Available. FINDINGS: Contralateral Common Femoral Vein: Respiratory phasicity is normal and symmetric with the symptomatic side. No evidence of thrombus. Normal compressibility. Common Femoral Vein: No evidence of thrombus. Normal compressibility, respiratory phasicity and response to augmentation. Saphenofemoral Junction: No evidence of thrombus. Normal compressibility and flow on color Doppler imaging. Profunda Femoral Vein: No evidence of thrombus. Normal compressibility and flow on color Doppler imaging. Femoral Vein: No evidence of thrombus. Normal compressibility, respiratory phasicity and response to augmentation. Popliteal Vein: No evidence of thrombus. Normal compressibility, respiratory phasicity and response to augmentation. Calf Veins: There is some thrombus in the visualized left peroneal vein, some of which appears echogenic and mural. While an acute component DVT cannot be excluded, some of this thrombus is clearly chronic in appearance. Visualized posterior tibial vein is normally patent. Superficial Great Saphenous Vein: No evidence of thrombus. Normal compressibility. Venous Reflux:  None. Other Findings: No evidence of superficial thrombophlebitis or abnormal fluid collection. 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 An acute component of DVT cannot be excluded in the peroneal vein. Electronically Signed   By: Irish Lack M.D.   On: 11/21/2022 15:51    Procedures Procedures    Medications Ordered in ED Medications  apixaban (ELIQUIS)  tablet 10 mg (has no administration in time range)    ED Course/ Medical Decision Making/ A&P                                 Medical Decision Making Amount and/or Complexity of Data Reviewed Labs: ordered. Radiology: ordered.  Risk Prescription drug management.   Tia Masker is here with left calf pain, swelling at the surgical site.  Unremarkable vitals.  No fever.  He is got swelling in the left inguinal area differential could be postop changes versus another hernia may be seroma but it does not really look infectious, not red or warm or fluctuant but tender when I push on it.  Him not able to reduce the swelling.  He was told that he should  have some swelling after surgery but this seems a little bit outside of what I would think but I will get a CT to further evaluate.  Will get a CBC and BMP as well.  He is already had an ultrasound done to his left lower leg given that he is having calf pain.  He has a history of DVT in the right leg in the past after a trauma.  He is not on blood thinners anymore.  Radiology reports that there is likely chronic peroneal clot but also could be an acute component.  Given that he is tender here and now nearly painful here just had recent surgery I do think that this is an acute clot which I will treat.  He is neurovascular neuromuscular intact otherwise but will await labs and CT imaging talk with general surgery and come up with a plan for anticoagulating him.  Per my review and interpretation labs are unremarkable.  No significant leukocytosis or anemia or electrolyte abnormality.  Patient called me back to the room.  He does not want to stay for CT scan.  I talked with Dr. Violeta Gelinas with neurosurgery.  I took a picture of the bulge that he was having in his left inguinal hernia site area.  Overall Dr. Janee Morn does think that this is likely the healing ridge from his mesh and muscle coming together.  There is no redness fluctuance and overall he is  not having any obstructive symptoms.  We were able to provide some reassurance and he understands return precautions.  Patient discharged in good condition.  Will start him on Eliquis.  Will have him follow-up with his primary care doctor for further anticoagulation goals.  He was educated about Eliquis and precautions to come back if he has any traumas or spontaneous bleeding.  This chart was dictated using voice recognition software.  Despite best efforts to proofread,  errors can occur which can change the documentation meaning.         Final Clinical Impression(s) / ED Diagnoses Final diagnoses:  Acute deep vein thrombosis (DVT) of proximal vein of left lower extremity (HCC)  Post-op pain    Rx / DC Orders ED Discharge Orders          Ordered    APIXABAN (ELIQUIS) VTE STARTER PACK (10MG  AND 5MG )  Status:  Discontinued        11/21/22 1822    APIXABAN (ELIQUIS) VTE STARTER PACK (10MG  AND 5MG )       Note to Pharmacy: If starter pack unavailable, substitute with seventy-four 5 mg apixaban tabs following the above SIG directions.   11/21/22 1823              Virgina Norfolk, DO 11/21/22 1825

## 2022-11-21 NOTE — ED Triage Notes (Signed)
Hernia surgery on 11/07/22  Normally a runner, now walking.  Some pain in left calf area, "feels different" some mild swelling  Also some swelling in scrotum area.  Hx blood clot 2004 after injury  Started a few days ago. Stretching helps leg pain

## 2022-11-21 NOTE — Discharge Instructions (Signed)
Eliquis.  You will need to take 10 mg twice a day for 7 days and then on day 8 take 5 mg twice a day.  Follow-up with your primary care doctor to further discuss her anticoagulation goals.  You are now being started on a blood thinner so if you have any injuries or fall especially to your head you should get evaluated.  Overall as we discussed the pain and swelling in your left groin is as expected.  This is a part of your mesh healing.  However if pain gets worse and you are having a hard time having bowel movements and having nausea or vomiting redness swelling that worsens please return for CT scan as we discussed.

## 2022-11-24 DIAGNOSIS — I82402 Acute embolism and thrombosis of unspecified deep veins of left lower extremity: Secondary | ICD-10-CM | POA: Diagnosis not present

## 2022-11-28 NOTE — Progress Notes (Signed)
   PROVIDER:  DEWARD PURCHASE STECHSCHULTE, MD  MRN: [id number] DOB: 01/14/1946 DATE OF ENCOUNTER: 11/28/2022 Interval History:     Had some significant swelling postoperatively.  Developed calf pain and was diagnosed with a DVT in his left calf    Physical Examination:   Physical Exam   Abd - hematoma left scrotum is not unexpected with the large hernia that the patient presented with   Assessment and Plan:     Philip Huber is a 77 y.o. male who underwent laparoscopic left inguinal hernia repair on 11/07/22.  He was seen in the ER and found to have a DVT on 11/21/22.  Diagnoses and all orders for this visit:  Postoperative state     Doing well postop.  Follow-up as needed.  Primary care doctor will be managing blood thinners.  Hematoma should slowly resolve with time.  If he still having problems 6 months out after surgery, could consider additional evaluation.    Return if symptoms worsen or fail to improve.   The plan was discussed in detail with the patient today, who expressed understanding.  The patient has my contact information, and understands to call me with any additional questions or concerns in the interval.  I would be happy to see the patient back sooner if the need arises.   PAUL JEFFREY STECHSCHULTE, MD

## 2022-12-23 DIAGNOSIS — I82402 Acute embolism and thrombosis of unspecified deep veins of left lower extremity: Secondary | ICD-10-CM | POA: Diagnosis not present

## 2023-01-27 DIAGNOSIS — Z79899 Other long term (current) drug therapy: Secondary | ICD-10-CM | POA: Diagnosis not present

## 2023-01-27 DIAGNOSIS — Z136 Encounter for screening for cardiovascular disorders: Secondary | ICD-10-CM | POA: Diagnosis not present

## 2023-01-27 DIAGNOSIS — Z13220 Encounter for screening for lipoid disorders: Secondary | ICD-10-CM | POA: Diagnosis not present

## 2023-01-29 DIAGNOSIS — I444 Left anterior fascicular block: Secondary | ICD-10-CM | POA: Diagnosis not present

## 2023-01-29 DIAGNOSIS — Z0000 Encounter for general adult medical examination without abnormal findings: Secondary | ICD-10-CM | POA: Diagnosis not present

## 2023-01-29 DIAGNOSIS — Z79899 Other long term (current) drug therapy: Secondary | ICD-10-CM | POA: Diagnosis not present

## 2023-01-29 DIAGNOSIS — I82402 Acute embolism and thrombosis of unspecified deep veins of left lower extremity: Secondary | ICD-10-CM | POA: Diagnosis not present

## 2023-01-29 DIAGNOSIS — I10 Essential (primary) hypertension: Secondary | ICD-10-CM | POA: Diagnosis not present

## 2023-02-02 ENCOUNTER — Other Ambulatory Visit (HOSPITAL_COMMUNITY): Payer: Self-pay | Admitting: Family Medicine

## 2023-02-02 DIAGNOSIS — I824Y2 Acute embolism and thrombosis of unspecified deep veins of left proximal lower extremity: Secondary | ICD-10-CM

## 2023-02-17 ENCOUNTER — Ambulatory Visit (HOSPITAL_COMMUNITY)
Admission: RE | Admit: 2023-02-17 | Discharge: 2023-02-17 | Disposition: A | Discharge status: Home / Self Care | Payer: Medicare Other | Source: Ambulatory Visit | Attending: Cardiology | Admitting: Cardiology

## 2023-02-17 DIAGNOSIS — I824Y2 Acute embolism and thrombosis of unspecified deep veins of left proximal lower extremity: Secondary | ICD-10-CM | POA: Diagnosis not present

## 2023-02-28 DIAGNOSIS — H5203 Hypermetropia, bilateral: Secondary | ICD-10-CM | POA: Diagnosis not present

## 2023-02-28 DIAGNOSIS — H52223 Regular astigmatism, bilateral: Secondary | ICD-10-CM | POA: Diagnosis not present

## 2023-02-28 DIAGNOSIS — H25813 Combined forms of age-related cataract, bilateral: Secondary | ICD-10-CM | POA: Diagnosis not present

## 2023-02-28 DIAGNOSIS — H524 Presbyopia: Secondary | ICD-10-CM | POA: Diagnosis not present

## 2023-03-03 DIAGNOSIS — D6859 Other primary thrombophilia: Secondary | ICD-10-CM | POA: Diagnosis not present

## 2023-03-03 DIAGNOSIS — Z86718 Personal history of other venous thrombosis and embolism: Secondary | ICD-10-CM | POA: Diagnosis not present

## 2023-03-03 DIAGNOSIS — I82409 Acute embolism and thrombosis of unspecified deep veins of unspecified lower extremity: Secondary | ICD-10-CM | POA: Diagnosis not present

## 2023-04-07 DIAGNOSIS — D6859 Other primary thrombophilia: Secondary | ICD-10-CM | POA: Diagnosis not present

## 2023-08-15 DIAGNOSIS — I10 Essential (primary) hypertension: Secondary | ICD-10-CM | POA: Diagnosis not present

## 2023-09-14 DIAGNOSIS — I10 Essential (primary) hypertension: Secondary | ICD-10-CM | POA: Diagnosis not present

## 2023-10-15 DIAGNOSIS — I10 Essential (primary) hypertension: Secondary | ICD-10-CM | POA: Diagnosis not present

## 2023-11-15 DIAGNOSIS — I10 Essential (primary) hypertension: Secondary | ICD-10-CM | POA: Diagnosis not present

## 2023-12-15 DIAGNOSIS — I10 Essential (primary) hypertension: Secondary | ICD-10-CM | POA: Diagnosis not present
